# Patient Record
Sex: Female | Born: 2000 | Race: Black or African American | Hispanic: No | Marital: Single | Smoking: Never smoker
Health system: Southern US, Community
[De-identification: ages and names within clinical notes are randomized; demographics above are authoritative.]

## PROBLEM LIST (undated history)

## (undated) HISTORY — PX: APPENDECTOMY: SHX54

---
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
## 2020-10-26 ENCOUNTER — Encounter (HOSPITAL_BASED_OUTPATIENT_CLINIC_OR_DEPARTMENT_OTHER): Payer: Self-pay | Admitting: Emergency Medicine

## 2020-10-26 ENCOUNTER — Other Ambulatory Visit: Payer: Self-pay

## 2020-10-26 DIAGNOSIS — R102 Pelvic and perineal pain: Secondary | ICD-10-CM | POA: Diagnosis present

## 2020-10-26 DIAGNOSIS — N939 Abnormal uterine and vaginal bleeding, unspecified: Secondary | ICD-10-CM | POA: Insufficient documentation

## 2020-10-26 NOTE — ED Triage Notes (Signed)
Reports her period started 6 days early and she's having excruciating cramps.

## 2020-10-27 ENCOUNTER — Emergency Department (HOSPITAL_BASED_OUTPATIENT_CLINIC_OR_DEPARTMENT_OTHER)
Admission: EM | Admit: 2020-10-27 | Discharge: 2020-10-27 | Disposition: A | Discharge status: Home / Self Care | Payer: 59 | Attending: Emergency Medicine | Admitting: Emergency Medicine

## 2020-10-27 ENCOUNTER — Emergency Department (HOSPITAL_BASED_OUTPATIENT_CLINIC_OR_DEPARTMENT_OTHER)
Admission: RE | Admit: 2020-10-27 | Discharge: 2020-10-27 | Disposition: A | Discharge status: Home / Self Care | Payer: 59 | Source: Ambulatory Visit | Attending: Emergency Medicine | Admitting: Emergency Medicine

## 2020-10-27 DIAGNOSIS — R102 Pelvic and perineal pain: Secondary | ICD-10-CM

## 2020-10-27 DIAGNOSIS — N83202 Unspecified ovarian cyst, left side: Secondary | ICD-10-CM | POA: Insufficient documentation

## 2020-10-27 DIAGNOSIS — R1032 Left lower quadrant pain: Secondary | ICD-10-CM | POA: Insufficient documentation

## 2020-10-27 DIAGNOSIS — N939 Abnormal uterine and vaginal bleeding, unspecified: Secondary | ICD-10-CM

## 2020-10-27 LAB — URINALYSIS, ROUTINE W REFLEX MICROSCOPIC
Bilirubin Urine: NEGATIVE
Glucose, UA: NEGATIVE mg/dL
Ketones, ur: NEGATIVE mg/dL
Leukocytes,Ua: NEGATIVE
Nitrite: NEGATIVE
Protein, ur: NEGATIVE mg/dL
Specific Gravity, Urine: 1.01 (ref 1.005–1.030)
pH: 7.5 (ref 5.0–8.0)

## 2020-10-27 LAB — URINALYSIS, MICROSCOPIC (REFLEX)

## 2020-10-27 LAB — PREGNANCY, URINE: Preg Test, Ur: NEGATIVE

## 2020-10-27 MED ORDER — KETOROLAC TROMETHAMINE 15 MG/ML IJ SOLN
15.0000 mg | Freq: Once | INTRAMUSCULAR | Status: AC
  Administered 2020-10-27: 15 mg via INTRAMUSCULAR
  Filled 2020-10-27: qty 1

## 2020-10-27 NOTE — ED Provider Notes (Signed)
Patient is a 20 year old female who had presented to the ED overnight for heavy vaginal bleeding and sharp left-sided throbbing pelvic pain.  GC testing was obtained.  Toradol IM was administered.  Plan was for ultrasound the morning.  Ultrasound obtained demonstrates normal uterus and endometrium.  There is however a 5.1 cm minimally complex left ovarian cyst that may represent a follicular cyst, endometrioma, or hemorrhagic cyst.  Recommending follow-up pelvic ultrasound in 6 weeks unless she is at particularly high risk for ovarian cancer.  I discussed findings with patient and her father.  They have a primary care provider and she will follow up with them this week.   Lorelee New, PA-C 10/27/20 1154    Maia Plan, MD 10/29/20 1011

## 2020-10-27 NOTE — ED Notes (Signed)
This RN chaperoned an abd u/s at bedside with EDP.

## 2020-10-27 NOTE — ED Provider Notes (Signed)
[facility] Provider Note: Lowella Dell, MD, FACEP  CSN: [id number] MRN: [id number] ARRIVAL: 10/26/20 at 2236 ROOM: MH12/[facility]12   CHIEF COMPLAINT  Abdominal Pain   HISTORY OF PRESENT ILLNESS  10/27/20 1:59 AM Patricia Aguilar is a 20 y.o. female who has had heavy vaginal bleeding (heavier than a period) and severe, sharp, throbbing left pelvic pain since about 12:30 PM yesterday afternoon.  She took Tylenol with some transient relief.  A subsequent dose of Tylenol did not help her symptoms at all.  She states she is 6 days early for her..  She does not believe she could be pregnant because she has not had intercourse.   History reviewed. No pertinent past medical history.  Past Surgical History:  Procedure Laterality Date  . APPENDECTOMY      No family history on file.  Social History   Tobacco Use  . Smoking status: Never Smoker  . Smokeless tobacco: Never Used  Vaping Use  . Vaping Use: Former  Substance Use Topics  . Alcohol use: Never  . Drug use: Never    Prior to Admission medications   Not on File    Allergies Patient has no known allergies.   REVIEW OF SYSTEMS  Negative except as noted here or in the History of Present Illness.   PHYSICAL EXAMINATION  Initial Vital Signs Blood pressure 124/79, pulse 90, temperature 98.2 F (36.8 C), temperature source Oral, resp. rate 16, height 5\' 4"  (1.626 m), weight 86.2 kg, last menstrual period 10/26/2020, SpO2 96 %.  Examination General: Well-developed, well-nourished female in no acute distress; appearance consistent with age of record HENT: normocephalic; atraumatic Eyes: pupils equal, round and reactive to light; extraocular muscles intact Neck: supple Heart: regular rate and rhythm Lungs: clear to auscultation bilaterally Abdomen: soft; nondistended; left lower quadrant tenderness; bowel sounds present Extremities: No deformity; full range of motion; pulses normal Neurologic: Awake, alert  and oriented; motor function intact in all extremities and symmetric; no facial droop Skin: Warm and dry Psychiatric: Normal mood and affect   RESULTS  Summary of this visit's results, reviewed and interpreted by myself:   EKG Interpretation  Date/Time:    Ventricular Rate:    PR Interval:    QRS Duration:   QT Interval:    QTC Calculation:   R Axis:     Text Interpretation:        Laboratory Studies: Results for orders placed or performed during the hospital encounter of 10/27/20 (from the past 24 hour(s))  Urinalysis, Routine w reflex microscopic     Status: Abnormal   Collection Time: 10/27/20  2:15 AM  Result Value Ref Range   Color, Urine YELLOW YELLOW   APPearance CLEAR CLEAR   Specific Gravity, Urine 1.010 1.005 - 1.030   pH 7.5 5.0 - 8.0   Glucose, UA NEGATIVE NEGATIVE mg/dL   Hgb urine dipstick MODERATE (A) NEGATIVE   Bilirubin Urine NEGATIVE NEGATIVE   Ketones, ur NEGATIVE NEGATIVE mg/dL   Protein, ur NEGATIVE NEGATIVE mg/dL   Nitrite NEGATIVE NEGATIVE   Leukocytes,Ua NEGATIVE NEGATIVE  Pregnancy, urine     Status: None   Collection Time: 10/27/20  2:15 AM  Result Value Ref Range   Preg Test, Ur NEGATIVE NEGATIVE  Urinalysis, Microscopic (reflex)     Status: Abnormal   Collection Time: 10/27/20  2:15 AM  Result Value Ref Range   RBC / HPF 11-20 0 - 5 RBC/hpf   WBC, UA 0-5 0 - 5 WBC/hpf  Bacteria, UA RARE (A) NONE SEEN   Squamous Epithelial / LPF 0-5 0 - 5   Imaging Studies: No results found.  ED COURSE and MDM  Nursing notes, initial and subsequent vitals signs, including pulse oximetry, reviewed and interpreted by myself.  Vitals:   10/26/20 2307 10/26/20 2309 10/27/20 0037 10/27/20 0223  BP:  (!) 132/102 124/79 127/77  Pulse:  94 90 82  Resp:  18 16 17   Temp:  98.2 F (36.8 C)    TempSrc:  Oral    SpO2:  100% 96% 97%  Weight: 86.2 kg     Height: 5\' 4"  (1.626 m)      Medications  ketorolac (TORADOL) 15 MG/ML injection 15 mg (has no  administration in time range)    We will administer a shot of Toradol IM and have her return for ultrasound later in the morning to evaluate for ovarian cyst.  Her pain is significantly better than it was earlier.  Urine sent for GC/chlamydia testing.  PROCEDURES  Procedures   ED DIAGNOSES     ICD-10-CM   1. Pelvic pain in female  R10.2   2. Abnormal vaginal bleeding  N93.9        Shakiya Mcneary, , MD 10/27/20 [phone]

## 2020-10-28 LAB — GC/CHLAMYDIA PROBE AMP (~~LOC~~) NOT AT ARMC
Chlamydia: NEGATIVE
Comment: NEGATIVE
Comment: NORMAL
Neisseria Gonorrhea: NEGATIVE

## 2021-07-03 IMAGING — US US PELVIS COMPLETE WITH TRANSVAGINAL
1 series · 13 of 25 positions shown · non-contrast
Comparison: None

CLINICAL DATA: Heavy painful menses with left pelvic/left lower
quadrant pain/cramping.

EXAM:
TRANSABDOMINAL AND TRANSVAGINAL ULTRASOUND OF PELVIS
TECHNIQUE: Both transabdominal and transvaginal ultrasound examinations of the
pelvis were performed. Transabdominal technique was performed for
global imaging of the pelvis including uterus, ovaries, adnexal
regions, and pelvic cul-de-sac. It was necessary to proceed with
endovaginal exam following the transabdominal exam to visualize the
endometrium and ovaries.

[Series 1: us pelvis complete with transvaginal · 13 of 81 slices shown]
[im 1/81]
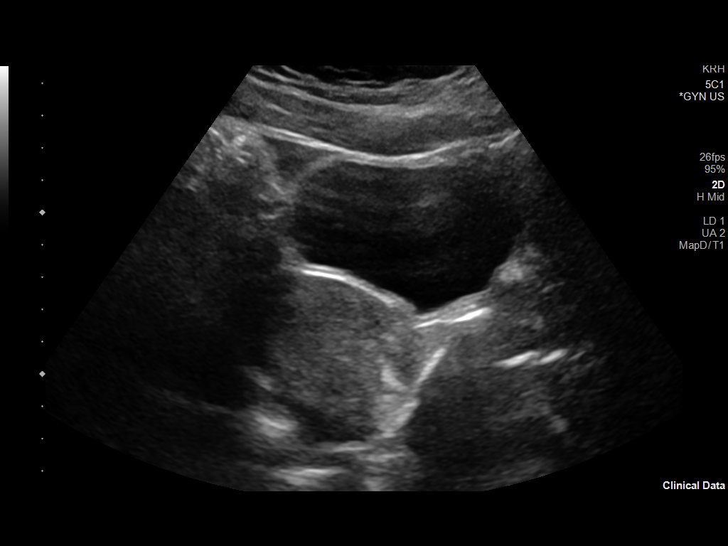
[im 7/81]
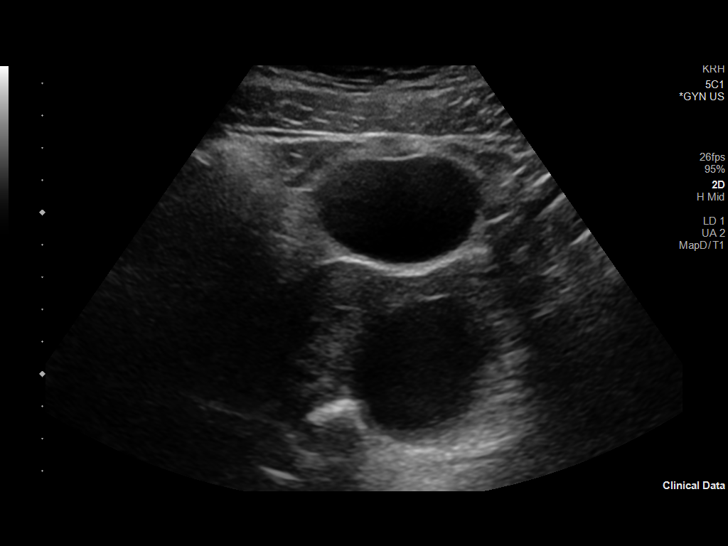
[im 14/81]
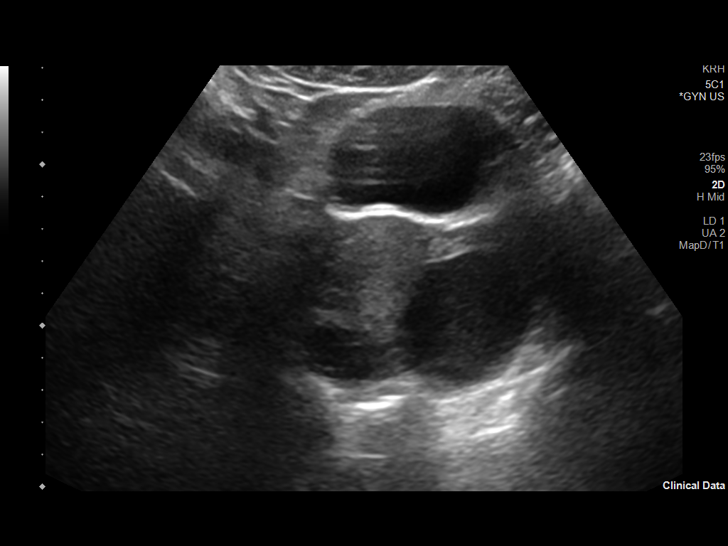
[im 21/81]
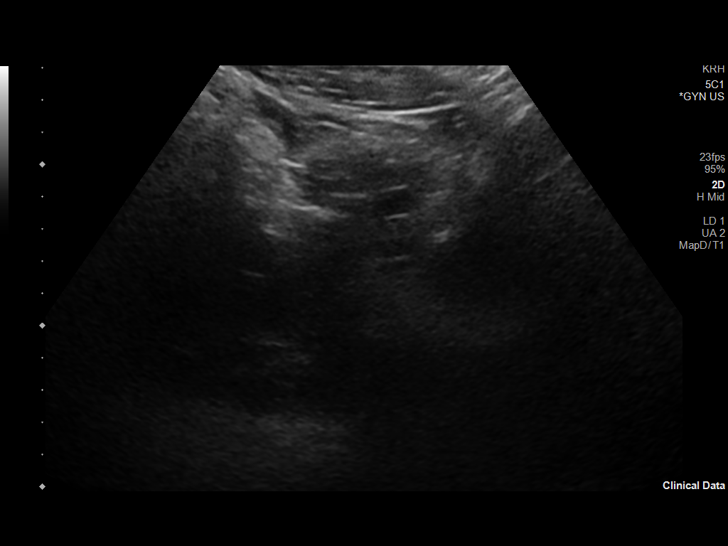
[im 27/81]
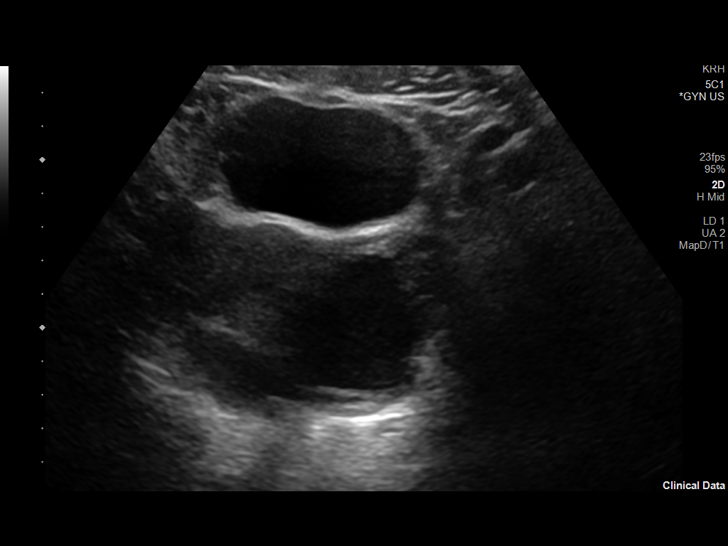
[im 34/81]
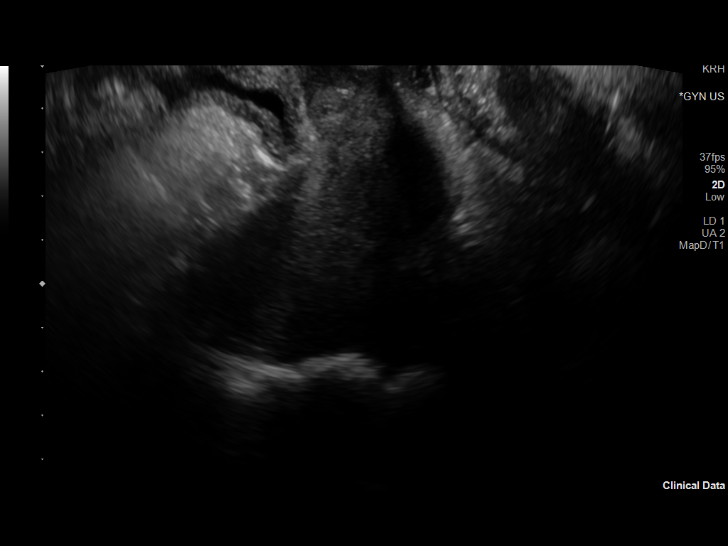
[im 41/81]
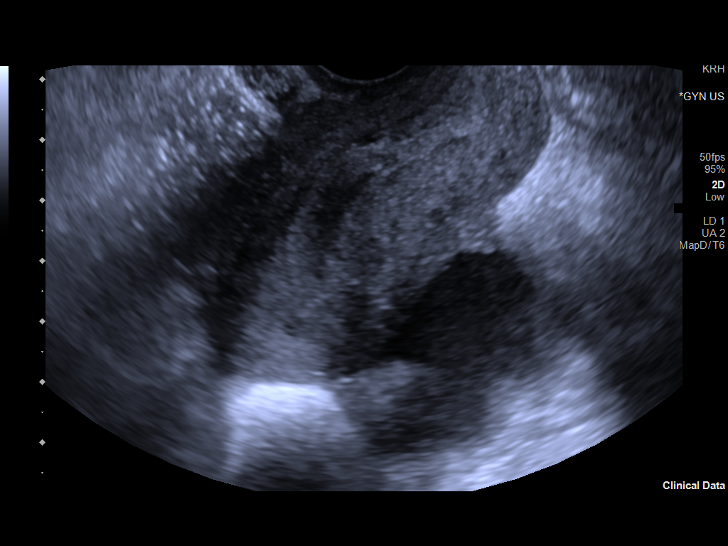
[im 47/81]
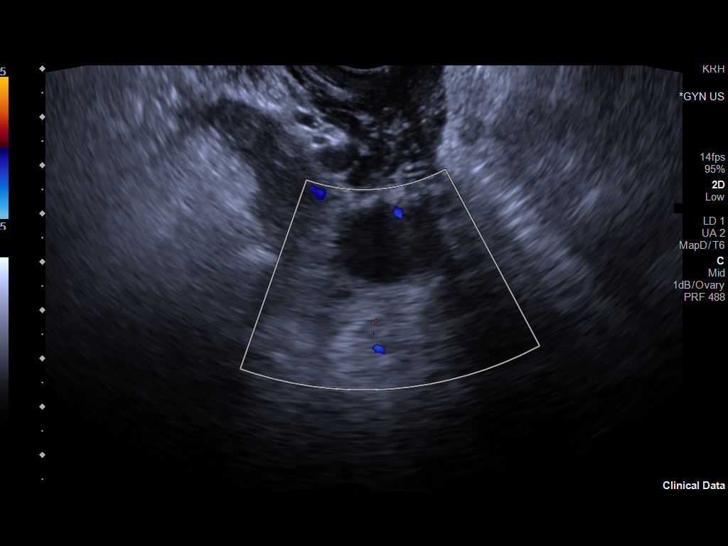
[im 54/81]
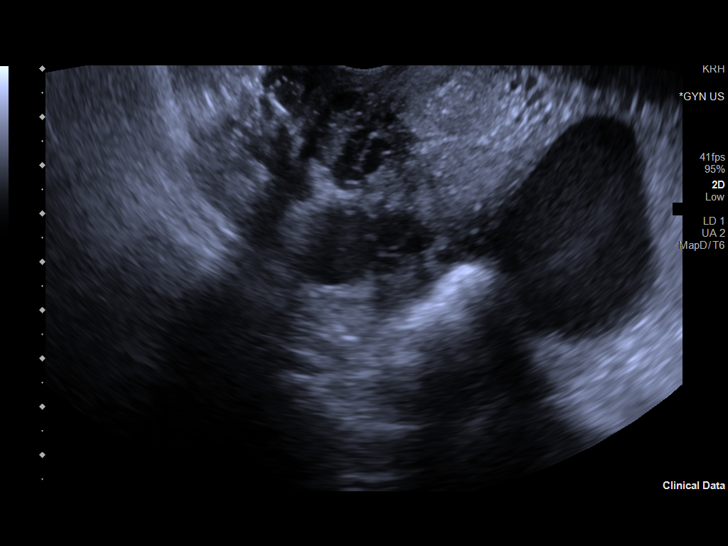
[im 61/81]
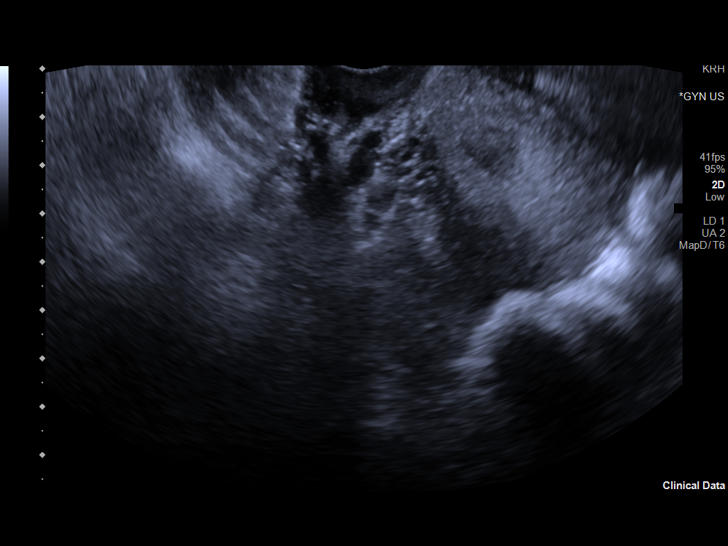
[im 67/81]
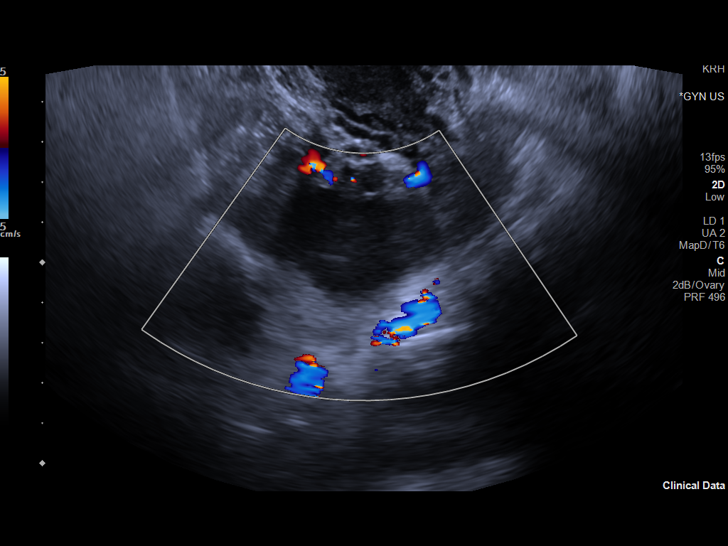
[im 74/81]
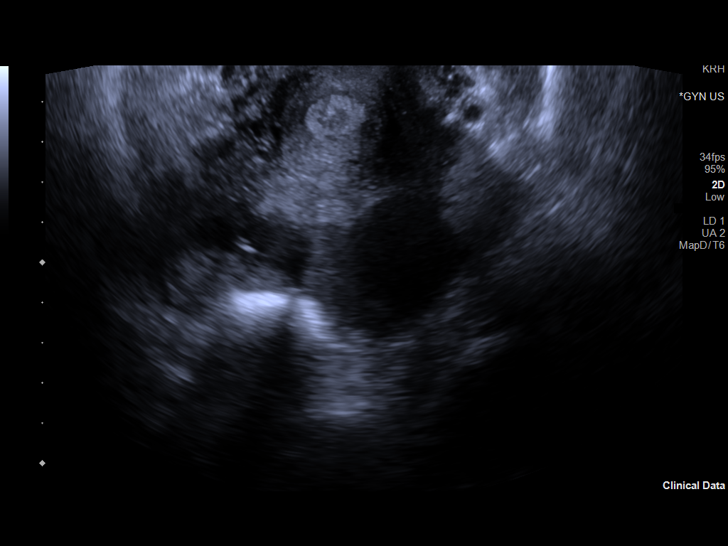
[im 81/81]
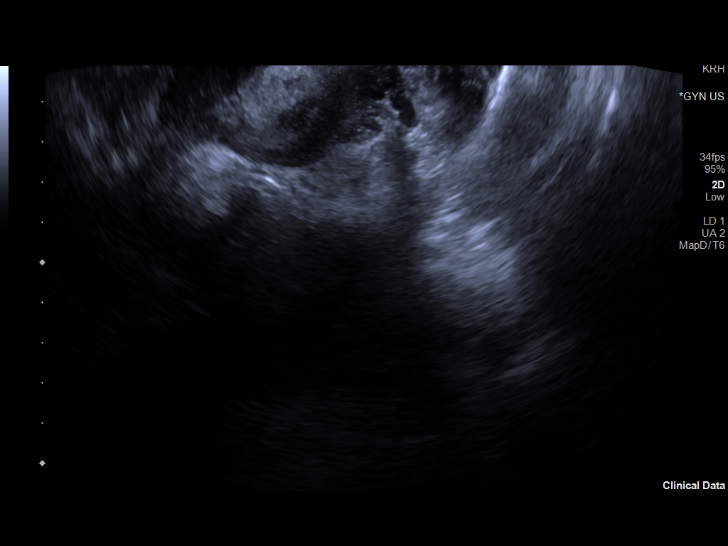

[13 of 25 positions shown; findings below may reference images not displayed]

FINDINGS: Uterus

Measurements: 7.1 x 3.6 x 4.4 cm = volume: 59 mL. No fibroids or
other mass visualized.

Endometrium

Thickness: 11 mm.  No focal abnormality visualized.

Right ovary

Measurements: 3.3 x 2.2 x 3.2 cm = volume: 12 mL. Normal
appearance/no adnexal mass. Normal color Doppler.

Left ovary

Measurements: 5.9 x 4.5 x 5.1 cm = volume: 71 mL. 5.1 cm cyst with
homogeneous low-level internal echoes and no internal vascularity.
This may represent a follicular cyst, endometrioma or hemorrhagic
cyst. Normal color Doppler.

Other findings

Trace free pelvic fluid.
IMPRESSION: 1.  Normal uterus and endometrium.

2. 5.1 cm minimally complex left ovarian cyst. This may represent a
follicular cyst, endometrioma or hemorrhagic cyst. Recommend
follow-up pelvic ultrasound 6 weeks. Note: This recommendation does
not apply to premenarchal patients or to those with increased risk
(genetic, family history, elevated tumor markers or other high-risk
factors) of ovarian cancer. Reference: Radiology [DATE];

## 2022-07-16 ENCOUNTER — Other Ambulatory Visit: Payer: Self-pay

## 2022-07-16 ENCOUNTER — Ambulatory Visit (INDEPENDENT_AMBULATORY_CARE_PROVIDER_SITE_OTHER): Payer: Self-pay

## 2022-07-16 DIAGNOSIS — M79641 Pain in right hand: Secondary | ICD-10-CM

## 2022-07-16 DIAGNOSIS — M25531 Pain in right wrist: Secondary | ICD-10-CM

## 2023-05-16 ENCOUNTER — Emergency Department (HOSPITAL_COMMUNITY): Payer: Self-pay

## 2023-05-16 ENCOUNTER — Emergency Department (HOSPITAL_COMMUNITY)
Admission: EM | Admit: 2023-05-16 | Discharge: 2023-05-17 | Disposition: A | Discharge status: Home / Self Care | Payer: BC Managed Care – PPO | Attending: Emergency Medicine | Admitting: Emergency Medicine

## 2023-05-16 ENCOUNTER — Encounter (HOSPITAL_COMMUNITY): Payer: Self-pay | Admitting: Emergency Medicine

## 2023-05-16 ENCOUNTER — Other Ambulatory Visit: Payer: Self-pay

## 2023-05-16 DIAGNOSIS — Y92410 Unspecified street and highway as the place of occurrence of the external cause: Secondary | ICD-10-CM | POA: Diagnosis not present

## 2023-05-16 DIAGNOSIS — M542 Cervicalgia: Secondary | ICD-10-CM | POA: Insufficient documentation

## 2023-05-16 LAB — HCG, SERUM, QUALITATIVE: Preg, Serum: NEGATIVE
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
## 2023-05-16 NOTE — ED Notes (Signed)
Pt switched to [facility] J collar

## 2023-05-16 NOTE — ED Notes (Signed)
Pt able to stand, ambulate short distance without balance difficulty. Pt does report pain down center of back with movement.
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
## 2023-05-16 NOTE — ED Notes (Signed)
Patient transported to CT 

## 2023-05-17 MED ORDER — METHOCARBAMOL 500 MG PO TABS: 500.0000 mg | ORAL_TABLET | Freq: Three times a day (TID) | ORAL | 0 refills | Status: AC | PRN

## 2023-05-17 MED ORDER — NAPROXEN 500 MG PO TABS: 500.0000 mg | ORAL_TABLET | Freq: Two times a day (BID) | ORAL | 0 refills | Status: AC

## 2023-05-17 MED ORDER — NAPROXEN 250 MG PO TABS
500.0000 mg | ORAL_TABLET | Freq: Once | ORAL | Status: AC
  Administered 2023-05-17: 500 mg via ORAL
  Filled 2023-05-17: qty 2

## 2023-05-17 MED ORDER — LIDOCAINE 5 % EX PTCH: 1.0000 | MEDICATED_PATCH | CUTANEOUS | 0 refills | Status: AC

## 2023-05-17 MED ORDER — METHOCARBAMOL 500 MG PO TABS
1000.0000 mg | ORAL_TABLET | Freq: Once | ORAL | Status: AC
  Administered 2023-05-17: 1000 mg via ORAL
  Filled 2023-05-17: qty 2

## 2023-05-17 NOTE — ED Notes (Signed)
Ccollar removed by MD Pilar Plate

## 2023-05-17 NOTE — Discharge Instructions (Signed)
You were evaluated in the Emergency Department and after careful evaluation, we did not find any emergent condition requiring admission or further testing in the hospital.  Your exam/testing today is overall reassuring.  Symptoms likely due to muscle strain or spasm.  Recommend using the Naprosyn twice daily as prescribed for pain.  Can use the Robaxin muscle relaxer for more significant pain, best used at night if you are having trouble sleeping as it can cause drowsiness.  You can also use the numbing patches provided daily.  Please return to the Emergency Department if you experience any worsening of your condition.   Thank you for allowing [place] to be a part of your care.
